# Patient Record
Sex: Male | Born: 1990 | Race: White | Hispanic: No | Marital: Single | Smoking: Never smoker
Health system: Southern US, Community
[De-identification: ages and names within clinical notes are randomized; demographics above are authoritative.]

## PROBLEM LIST (undated history)

## (undated) DIAGNOSIS — Z9109 Other allergy status, other than to drugs and biological substances: Secondary | ICD-10-CM

## (undated) HISTORY — PX: COSMETIC SURGERY: SHX468

---
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
## 2012-11-20 ENCOUNTER — Ambulatory Visit: Payer: Self-pay

## 2012-11-20 ENCOUNTER — Other Ambulatory Visit: Payer: Self-pay | Admitting: Occupational Medicine

## 2012-11-20 DIAGNOSIS — R52 Pain, unspecified: Secondary | ICD-10-CM

## 2012-11-30 ENCOUNTER — Emergency Department (HOSPITAL_COMMUNITY)
Admission: EM | Admit: 2012-11-30 | Discharge: 2012-11-30 | Disposition: A | Discharge status: Home / Self Care | Payer: BC Managed Care – PPO | Attending: Emergency Medicine | Admitting: Emergency Medicine

## 2012-11-30 ENCOUNTER — Encounter (HOSPITAL_COMMUNITY): Payer: Self-pay | Admitting: *Deleted

## 2012-11-30 DIAGNOSIS — L255 Unspecified contact dermatitis due to plants, except food: Secondary | ICD-10-CM | POA: Insufficient documentation

## 2012-11-30 DIAGNOSIS — L237 Allergic contact dermatitis due to plants, except food: Secondary | ICD-10-CM

## 2012-11-30 MED ORDER — DIPHENHYDRAMINE HCL 25 MG PO TABS: 25.0000 mg | ORAL_TABLET | Freq: Four times a day (QID) | ORAL | Status: AC | PRN

## 2012-11-30 MED ORDER — PREDNISONE 20 MG PO TABS
60.0000 mg | ORAL_TABLET | Freq: Once | ORAL | Status: AC
  Administered 2012-11-30: 60 mg via ORAL
  Filled 2012-11-30: qty 3

## 2012-11-30 MED ORDER — PREDNISONE 20 MG PO TABS: 40.0000 mg | ORAL_TABLET | Freq: Every day | ORAL | Status: AC

## 2012-11-30 NOTE — ED Provider Notes (Signed)
CSN: [id number]     Arrival date & time 11/30/12  1758 History  This chart was scribed for non-physician practitioner Roxy Horseman, PA-C working with Flint Melter, MD by Joaquin Music, ED Scribe. This patient was seen in room TR05C/TR05C and the patient's care was started at 6:03 PM    No chief complaint on file.  The history is provided by the patient. No language interpreter was used.   HPI Comments: Richard Mcdonald is a 22 y.o. male who presents to the Emergency Department chief complaint of diffuse rash with associated mild swelling that began this morning. Pt states he was working in his garden yesterday and thinks he may have come in contact with poison ivy. Pt denies taking any medication. Pt denies fevers or other complaints. Denies tick bites.    No past medical history on file. No past surgical history on file. No family history on file. History  Substance Use Topics  . Smoking status: Not on file  . Smokeless tobacco: Not on file  . Alcohol Use: Not on file    Review of Systems A complete 10 system review of systems was obtained and all systems are negative except as noted in the HPI and PMH.   Allergies  Review of patient's allergies indicates not on file.  Home Medications  No current outpatient prescriptions on file. BP 136/72  Pulse 63  Temp(Src) 98.2 F (36.8 C) (Oral)  Resp 16  SpO2 97%  Physical Exam  Nursing note and vitals reviewed. Constitutional: He is oriented to person, place, and time. He appears well-developed and well-nourished. No distress.  HENT:  Head: Normocephalic and atraumatic.  Eyes: EOM are normal.  Neck: Neck supple. No tracheal deviation present.  Cardiovascular: Normal rate.   Pulmonary/Chest: Effort normal. No respiratory distress.  Musculoskeletal: Normal range of motion.  Neurological: He is alert and oriented to person, place, and time.  Skin: Skin is warm and dry.  Diffuse maculopapular rash to upper and lower  extremities, no evidence of targetoid rash  Psychiatric: He has a normal mood and affect. His behavior is normal.    ED Course  Procedures  DIAGNOSTIC STUDIES: Oxygen Saturation is 97% on RA, normal by my interpretation.    COORDINATION OF CARE: 6:12 PM-Discussed treatment plan which includes taking Benadryl and  prescribing Prednisone  with pt at bedside and pt agreed to plan.    Labs Review Labs Reviewed - No data to display Imaging Review No results found.  MDM   1. Poison ivy    Patient with poison ivy.  Will treat with prednisone and benadryl.  Follow-up with PCP.  Patient is stable and ready for discharge.  No fevers.  No reported tick bites.  No arthralgias or myalgias.  I personally performed the services described in this documentation, which was scribed in my presence. The recorded information has been reviewed and is accurate.       Roxy Horseman, PA-C 11/30/12 1902

## 2012-11-30 NOTE — ED Notes (Signed)
Pt reports being outside removing debris and got poison ivy. States he has had it before and knows that is what he has.

## 2012-12-01 NOTE — ED Provider Notes (Signed)
Medical screening examination/treatment/procedure(s) were performed by non-physician practitioner and as supervising physician I was immediately available for consultation/collaboration.  Randie Bloodgood L Audel Coakley, MD 12/01/12 0104 

## 2012-12-03 ENCOUNTER — Emergency Department (HOSPITAL_COMMUNITY)
Admission: EM | Admit: 2012-12-03 | Discharge: 2012-12-03 | Disposition: A | Discharge status: Home / Self Care | Payer: BC Managed Care – PPO | Attending: Emergency Medicine | Admitting: Emergency Medicine

## 2012-12-03 ENCOUNTER — Encounter (HOSPITAL_COMMUNITY): Payer: Self-pay

## 2012-12-03 DIAGNOSIS — L255 Unspecified contact dermatitis due to plants, except food: Secondary | ICD-10-CM | POA: Insufficient documentation

## 2012-12-03 DIAGNOSIS — L237 Allergic contact dermatitis due to plants, except food: Secondary | ICD-10-CM

## 2012-12-03 HISTORY — DX: Other allergy status, other than to drugs and biological substances: Z91.09

## 2012-12-03 MED ORDER — PREDNISONE 20 MG PO TABS: 40.0000 mg | ORAL_TABLET | Freq: Every day | ORAL | Status: AC

## 2012-12-03 MED ORDER — DEXAMETHASONE SODIUM PHOSPHATE 10 MG/ML IJ SOLN
10.0000 mg | Freq: Once | INTRAMUSCULAR | Status: AC
  Administered 2012-12-03: 10 mg via INTRAMUSCULAR
  Filled 2012-12-03: qty 1

## 2012-12-03 NOTE — ED Notes (Signed)
Hannah, PA at the bedside.  
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
## 2012-12-03 NOTE — ED Notes (Signed)
Pt was seen on 11/30/12 for same and received prednisone and benadryl. Does not feel like it is helping. Has also been taking oatmeal baths. Feels worse.

## 2012-12-03 NOTE — ED Notes (Signed)
Dr. wickline at the bedside.  

## 2012-12-03 NOTE — ED Notes (Signed)
Dr. Wickline at the bedside.  

## 2012-12-04 NOTE — ED Provider Notes (Signed)
Medical screening examination/treatment/procedure(s) were conducted as a shared visit with non-physician practitioner(s) and myself.  I personally evaluated the patient during the encounter  Pt stable in the ED.  Advised continued use of prednisone and he is agreeable with plan   Joya Gaskins, MD 12/04/12 1123

## 2014-11-17 ENCOUNTER — Emergency Department (HOSPITAL_COMMUNITY)
Admission: EM | Admit: 2014-11-17 | Discharge: 2014-11-17 | Disposition: A | Discharge status: Home / Self Care | Payer: Self-pay | Attending: Emergency Medicine | Admitting: Emergency Medicine

## 2014-11-17 ENCOUNTER — Emergency Department (HOSPITAL_COMMUNITY): Payer: Self-pay

## 2014-11-17 ENCOUNTER — Encounter (HOSPITAL_COMMUNITY): Payer: Self-pay

## 2014-11-17 DIAGNOSIS — S62325A Displaced fracture of shaft of fourth metacarpal bone, left hand, initial encounter for closed fracture: Secondary | ICD-10-CM | POA: Insufficient documentation

## 2014-11-17 DIAGNOSIS — S62308A Unspecified fracture of other metacarpal bone, initial encounter for closed fracture: Secondary | ICD-10-CM

## 2014-11-17 DIAGNOSIS — S62323A Displaced fracture of shaft of third metacarpal bone, left hand, initial encounter for closed fracture: Secondary | ICD-10-CM | POA: Insufficient documentation

## 2014-11-17 DIAGNOSIS — S62321A Displaced fracture of shaft of second metacarpal bone, left hand, initial encounter for closed fracture: Secondary | ICD-10-CM | POA: Insufficient documentation

## 2014-11-17 DIAGNOSIS — Y998 Other external cause status: Secondary | ICD-10-CM | POA: Insufficient documentation

## 2014-11-17 DIAGNOSIS — Y9355 Activity, bike riding: Secondary | ICD-10-CM | POA: Insufficient documentation

## 2014-11-17 DIAGNOSIS — Y9389 Activity, other specified: Secondary | ICD-10-CM | POA: Insufficient documentation

## 2014-11-17 DIAGNOSIS — S62301A Unspecified fracture of second metacarpal bone, left hand, initial encounter for closed fracture: Secondary | ICD-10-CM

## 2014-11-17 DIAGNOSIS — Y92410 Unspecified street and highway as the place of occurrence of the external cause: Secondary | ICD-10-CM | POA: Insufficient documentation

## 2014-11-17 MED ORDER — OXYCODONE-ACETAMINOPHEN 5-325 MG PO TABS: 1.0000 | ORAL_TABLET | Freq: Four times a day (QID) | ORAL | Status: AC | PRN

## 2014-11-17 MED ORDER — TETANUS-DIPHTH-ACELL PERTUSSIS 5-2.5-18.5 LF-MCG/0.5 IM SUSP
0.5000 mL | Freq: Once | INTRAMUSCULAR | Status: AC
  Administered 2014-11-17: 0.5 mL via INTRAMUSCULAR
  Filled 2014-11-17: qty 0.5

## 2014-11-17 MED ORDER — MORPHINE SULFATE (PF) 4 MG/ML IV SOLN
8.0000 mg | Freq: Once | INTRAVENOUS | Status: AC
  Administered 2014-11-17: 8 mg via INTRAVENOUS
  Filled 2014-11-17: qty 2

## 2014-11-17 NOTE — ED Notes (Signed)
Ortho tech at bedside 

## 2014-11-17 NOTE — ED Notes (Signed)
EDP at bedside  
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
## 2014-11-17 NOTE — Discharge Instructions (Signed)
Boxer's Fracture Boxer's fracture is a broken bone (fracture) of the fourth or fifth metacarpal (ring or pinky finger). The metacarpal bones connect the wrist to the fingers and make up the arch of the hand. Boxer's fracture occurs toward the body (proximal) from the first knuckle. This injury is known as a boxer's fracture, because it often occurs from hitting an object with a closed fist. SYMPTOMS   Severe pain at the time of injury.  Pain and swelling around the first knuckle on the fourth or fifth finger.  Bruising (contusion) in the area within 48 hours of injury.  Visible deformity, such as a pushed down knuckle. This can occur if the fracture is complete, and the bone fragments separate enough to distort normal body shape.  Numbness or paralysis from swelling in the hand, causing pressure on the blood vessels or nerves (uncommon). CAUSES   Direct injury (trauma), such as a striking blow with the fist.  Indirect stress to the hand, such as twisting or violent muscle contraction (uncommon). RISK INCREASES WITH:  Punching an object with an unprotected knuckle.  Contact sports (football, rugby).  Sports that require hitting (boxing, martial arts).  History of bone or joint disease (osteoporosis).  PROGNOSIS  When proper treatment is given, to ensure normal alignment of the bones, healing can usually be expected in 4 to 6 weeks. Occasionally, surgery is necessary.  RELATED COMPLICATIONS   Bone does not heal back together (nonunion).  Bone heals together in an improper position (malunion), causing twisting of the finger when making a fist.  Chronic pain, stiffness, or swelling of the hand.  Excessive bleeding in the hand, causing pressure and injury to nerves and blood vessels (rare).  Stopping of normal hand growth in children.  Infection of the wound, if skin is broken over the fracture (open fracture), or at the incision site if surgery is performed.  Shortening of  injured bones.  Bony bump (prominence) in the palm or loss of shape of the knuckles.  Pain and weakness when gripping.  Arthritis of the affected joint, if the fracture goes into the joint, after repeated injury, or after delayed treatment.  Scarring around the knuckle, and limited motion. TREATMENT  Treatment varies, depending on the injury. The place in the hand where the injury occurs has a great deal of motion, which allows the hand to move properly. If the fracture is not aligned properly, this function may be decreased. If the bone ends are in proper alignment, treatment first involves ice and elevation of the injured hand, at or above heart level, to reduce inflammation. Pain medicines help to relieve pain. Treatment also involves restraint by splinting, bandaging, casting, or bracing for 4 or more weeks.  If the fracture is out of alignment (displaced), or it involves the joint, surgery is usually recommended. Surgery typically involves cutting through the skin to place removable pins, screws, and sometimes plates over the fracture. After surgery, the bone and joint are restrained for 4 or more weeks. After restraint (with or without surgery), stretching and strengthening exercises are needed to regain proper strength and function of the joint. Exercises may be done at home or with the assistance of a therapist. Depending on the sport and position played, a brace or splint may be recommended when first returning to sports.  MEDICATION   If pain medication is necessary, nonsteroidal anti-inflammatory medications, such as aspirin and ibuprofen, or other minor pain relievers, such as acetaminophen, are often recommended.  Do not take  pain medication for 7 days before surgery.  Prescription pain relievers may be necessary. Use only as directed and only as much as you need. COLD THERAPY Cold treatment (icing) relieves pain and reduces inflammation. Cold treatment should be applied for 10 to 15  minutes every 2 to 3 hours for inflammation and pain, and immediately after any activity that aggravates your symptoms. Use ice packs or an ice massage. SEEK MEDICAL CARE IF:   Pain, tenderness, or swelling gets worse, despite treatment.  You experience pain, numbness, or coldness in the hand.  Blue, gray, or dark color appears in the fingernails.  You develop signs of infection, after surgery (fever, increased pain, swelling, redness, drainage of fluids, or bleeding in the surgical area).  You feel you have reinjured the hand.  New, unexplained symptoms develop. (Drugs used in treatment may produce side effects.) Document Released: 03/06/2005 Document Revised: 05/29/2011 Document Reviewed: 06/18/2008 [facility] Patient Information 2015 Richard Mcdonald, [place]. This information is not intended to replace advice given to you by your health care provider. Make sure you discuss any questions you have with your health care provider.

## 2014-11-17 NOTE — Progress Notes (Signed)
Orthopedic Tech Progress Note Patient Details:  Richard Mcdonald 1991-03-20 [id number]  Ortho Devices Type of Ortho Device: Ace wrap, Volar splint Ortho Device/Splint Location: lue Ortho Device/Splint Interventions: Application   Richard Mcdonald 11/17/2014, 9:02 AM
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
